# Patient Record
Sex: Male | Born: 1980 | Race: White | Hispanic: No | Marital: Married | Smoking: Never smoker
Health system: Southern US, Community
[De-identification: ages and names within clinical notes are randomized; demographics above are authoritative.]

## PROBLEM LIST (undated history)

## (undated) DIAGNOSIS — C801 Malignant (primary) neoplasm, unspecified: Secondary | ICD-10-CM

## (undated) HISTORY — PX: OTHER SURGICAL HISTORY: SHX169

## (undated) HISTORY — PX: SKIN CANCER EXCISION: SHX779

## (undated) HISTORY — DX: Malignant (primary) neoplasm, unspecified: C80.1

---

## 2014-08-17 ENCOUNTER — Ambulatory Visit (INDEPENDENT_AMBULATORY_CARE_PROVIDER_SITE_OTHER): Payer: BLUE CROSS/BLUE SHIELD | Admitting: Physician Assistant

## 2014-08-17 VITALS — BP 108/64 | HR 53 | Temp 98.3°F | Resp 14 | Ht 73.0 in | Wt 222.0 lb

## 2014-08-17 DIAGNOSIS — R51 Headache: Secondary | ICD-10-CM | POA: Diagnosis not present

## 2014-08-17 DIAGNOSIS — R519 Headache, unspecified: Secondary | ICD-10-CM

## 2014-08-17 LAB — POCT CBC
Granulocyte percent: 61.9 %G (ref 37–80)
HCT, POC: 47.9 % (ref 43.5–53.7)
HEMOGLOBIN: 15.2 g/dL (ref 14.1–18.1)
Lymph, poc: 1.4 (ref 0.6–3.4)
MCH: 30.1 pg (ref 27–31.2)
MCHC: 31.8 g/dL (ref 31.8–35.4)
MCV: 94.8 fL (ref 80–97)
MID (CBC): 0.4 (ref 0–0.9)
MPV: 8.1 fL (ref 0–99.8)
PLATELET COUNT, POC: 186 10*3/uL (ref 142–424)
POC Granulocyte: 2.9 (ref 2–6.9)
POC LYMPH PERCENT: 28.9 %L (ref 10–50)
POC MID %: 9.2 %M (ref 0–12)
RBC: 5.05 M/uL (ref 4.69–6.13)
RDW, POC: 13.4 %
WBC: 4.7 10*3/uL (ref 4.6–10.2)

## 2014-08-17 LAB — COMPLETE METABOLIC PANEL WITH GFR
ALBUMIN: 4.6 g/dL (ref 3.6–5.1)
ALT: 17 U/L (ref 9–46)
AST: 19 U/L (ref 10–40)
Alkaline Phosphatase: 54 U/L (ref 40–115)
BUN: 12 mg/dL (ref 7–25)
CALCIUM: 9.7 mg/dL (ref 8.6–10.3)
CHLORIDE: 102 mmol/L (ref 98–110)
CO2: 30 mmol/L (ref 20–31)
Creat: 1.01 mg/dL (ref 0.60–1.35)
GFR, Est African American: >89 mL/min (ref >=60)
Glucose, Bld: 82 mg/dL (ref 65–99)
Potassium: 4.5 mmol/L (ref 3.5–5.3)
Sodium: 141 mmol/L (ref 135–146)
TOTAL PROTEIN: 6.3 g/dL (ref 6.1–8.1)
Total Bilirubin: 0.7 mg/dL (ref 0.2–1.2)

## 2014-08-17 LAB — POCT SEDIMENTATION RATE: POCT SED RATE: 2 mm/hr (ref 0–22)

## 2014-08-17 MED ORDER — CYCLOBENZAPRINE HCL 10 MG PO TABS: 10.0000 mg | ORAL_TABLET | Freq: Three times a day (TID) | ORAL | Status: DC | PRN

## 2014-08-17 MED ORDER — MELOXICAM 7.5 MG PO TABS: 7.5000 mg | ORAL_TABLET | Freq: Every day | ORAL | Status: DC

## 2014-08-17 NOTE — Progress Notes (Signed)
[facility] [address] [place] [address] [phone]  Date:  08/17/2014   Name:  Roy Williams   DOB:  April 23, 1980   MRN:  [id number]  PCP:  No primary care provider on file.    History of Present Illness:  Roy Williams is a 34 y.o. male patient who presents to [facility] for headache that has occurred intermittently for the last 2-3 weeks. Patient notes that he first noticed the head pain when he was showering and he rubbed a part of his neck that radiated pain towards his right temporal lobe. They last only for seconds.  This pain can also be incited by slight impact such as stepping off of a stoop or jumping, though he has no pain when he is running on the treadmill. He became more concerned when over the last week he developed some fatigue and weakness. He has no fever, no nausea.  He has no auras photophobia or phonophobia.  He has pain into the shoulders that can radiate to the head.  This has been associated with work stressors.  He denies numbness tingling or rash.  He has been rubbing the He says that he feels lightheaded which can occur randomly even at rest. He has a history of frontal headaches.   There are no active problems to display for this patient.   Past Medical History  Diagnosis Date  . Cancer     skin    Past Surgical History  Procedure Laterality Date  . Sinus polyp removal    . Skin cancer excision      Social History  Substance Use Topics  . Smoking status: Never Smoker   . Smokeless tobacco: None  . Alcohol Use: No    Family History  Problem Relation Age of Onset  . Cancer Mother   . Cancer Maternal Grandmother     No Known Allergies  Medication list has been reviewed and updated.  No current outpatient prescriptions on file prior to visit.   No current facility-administered medications on file prior to visit.    ROS ROS otherwise unremarkable unless listed above.   Physical Examination: BP 108/64 mmHg  Pulse  53  Temp(Src) 98.3 F (36.8 C) (Oral)  Resp 14  Ht 6\' 1"  (1.854 m)  Wt 222 lb (100.699 kg)  BMI 29.30 kg/m2  SpO2 99% Ideal Body Weight: Weight in (lb) to have BMI = 25: 189.1  Physical Exam  Constitutional: He is oriented to person, place, and time. He appears well-developed and well-nourished.  HENT:  Head: Normocephalic and atraumatic.  Eyes: EOM are normal. Pupils are equal, round, and reactive to light.  Cardiovascular: Normal rate, regular rhythm, normal heart sounds and intact distal pulses.  Exam reveals no gallop and no friction rub.   No murmur heard. Pulmonary/Chest: Effort normal and breath sounds normal. No apnea. No respiratory distress. He has no decreased breath sounds. He has no wheezes.  Musculoskeletal:       Right shoulder: He exhibits tenderness (along trapezius).  Neurological: He is alert and oriented to person, place, and time. He has normal reflexes. No cranial nerve deficit. He displays a negative Romberg sign. Coordination normal.  F to N, H to S, tandem gait  Skin: Skin is warm and dry.  Erythematous non-raised marking just superior-anterior to right ear.  Non-tender, and no hair breakage.     Psychiatric: He has a normal mood and affect. His behavior is normal.  Results for orders placed or performed in visit on 08/17/14  POCT CBC  Result Value Ref Range   WBC 4.7 4.6 - 10.2 K/uL   Lymph, poc 1.4 0.6 - 3.4   POC LYMPH PERCENT 28.9 10 - 50 %L   MID (cbc) 0.4 0 - 0.9   POC MID % 9.2 0 - 12 %M   POC Granulocyte 2.9 2 - 6.9   Granulocyte percent 61.9 37 - 80 %G   RBC 5.05 4.69 - 6.13 M/uL   Hemoglobin 15.2 14.1 - 18.1 g/dL   HCT, POC 47.9 43.5 - 53.7 %   MCV 94.8 80 - 97 fL   MCH, POC 30.1 27 - 31.2 pg   MCHC 31.8 31.8 - 35.4 g/dL   RDW, POC 13.4 %   Platelet Count, POC 186 142 - 424 K/uL   MPV 8.1 0 - 99.8 fL   Orthostatic VS for the past 24 hrs:  BP- Lying Pulse- Lying BP- Sitting Pulse- Sitting BP- Standing at 0 minutes Pulse- Standing at  0 minutes  08/17/14 1304 114/74 mmHg 51 130/79 mmHg 58 123/85 mmHg 56    Assessment and Plan: 34 year old male is here today for chief complaint of head pain.  Appears to be tension with musculature based.  I am starting flexeril at night.  Advised increase in caloric intake given workout regimen.  Vitals, physical, orthostats, and cbc unremarkable at this time.  Advised to return if sxs continue, and monitor heart rate.  He is new to this practice and with his workout activities, he may have slower bpm.    1. Headache, unspecified headache type - POCT CBC - COMPLETE METABOLIC PANEL WITH GFR - POCT SEDIMENTATION RATE   Ivar Drape, PA-C [facility] 08/17/2014 1:07 PM

## 2014-08-17 NOTE — Patient Instructions (Signed)
Please increase your calorie intake to at least 3000 calories per day.     Take the flexeril at night.  Do ROM exercises of the neck, slowly flexing, extending, turning, and deviating the head. Also take ibuprofen 600mg  every 4 hours as needed for head pain.

## 2014-08-21 ENCOUNTER — Encounter: Payer: Self-pay | Admitting: Physician Assistant

## 2014-08-21 ENCOUNTER — Ambulatory Visit (INDEPENDENT_AMBULATORY_CARE_PROVIDER_SITE_OTHER): Payer: BLUE CROSS/BLUE SHIELD | Admitting: Physician Assistant

## 2014-08-21 VITALS — BP 118/76 | HR 56 | Temp 98.3°F | Resp 16 | Ht 73.0 in | Wt 218.8 lb

## 2014-08-21 DIAGNOSIS — R51 Headache: Secondary | ICD-10-CM

## 2014-08-21 DIAGNOSIS — R519 Headache, unspecified: Secondary | ICD-10-CM

## 2014-08-21 DIAGNOSIS — Z09 Encounter for follow-up examination after completed treatment for conditions other than malignant neoplasm: Secondary | ICD-10-CM

## 2014-08-21 DIAGNOSIS — R42 Dizziness and giddiness: Secondary | ICD-10-CM

## 2014-08-23 ENCOUNTER — Encounter: Payer: Self-pay | Admitting: Physician Assistant

## 2014-08-26 ENCOUNTER — Encounter: Payer: Self-pay | Admitting: Physician Assistant

## 2014-08-26 NOTE — Progress Notes (Signed)
[facility] [address] [place] [address] [phone]  Date:  08/21/2014   Name:  Roy Williams   DOB:  02-02-1980   MRN:  [id number]  PCP:  No primary care provider on file.    History of Present Illness:  Roy Williams is a 34 y.o. male patient who presents to [facility] for follow up of head pain.  He was seen here 4 days ago, for head pain that radiated from his right shoulder/neck toward his right temporal area.  He also had dizziness at this time.  He states that his sxs have resolved.  He no longer has head pain at his right side or dizziness.  He has used the flexeril as prescribed which helped his muscle tension.  He also attempted to increase his caloric intake, as instructed at his visit.  He is not sure which is improving his sxs.  His heart rate has stayed within (50-60) at home.      There are no active problems to display for this patient.   Past Medical History  Diagnosis Date  . Cancer     skin    Past Surgical History  Procedure Laterality Date  . Sinus polyp removal    . Skin cancer excision      Social History  Substance Use Topics  . Smoking status: Never Smoker   . Smokeless tobacco: None  . Alcohol Use: No    Family History  Problem Relation Age of Onset  . Cancer Mother   . Cancer Maternal Grandmother     No Known Allergies  Medication list has been reviewed and updated.  Current Outpatient Prescriptions on File Prior to Visit  Medication Sig Dispense Refill  . cyclobenzaprine (FLEXERIL) 10 MG tablet Take 1 tablet (10 mg total) by mouth 3 (three) times daily as needed for muscle spasms. 30 tablet 0   No current facility-administered medications on file prior to visit.    ROS ROS otherwsie unremarkable unless listed above.    Physical Examination: BP 118/76 mmHg  Pulse 56  Temp(Src) 98.3 F (36.8 C) (Oral)  Resp 16  Ht 6\' 1"  (1.854 m)  Wt 218 lb 12.8 oz (99.247 kg)  BMI 28.87 kg/m2  SpO2 96% Ideal Body  Weight: Weight in (lb) to have BMI = 25: 189.1  Physical Exam  Constitutional: He is oriented to person, place, and time.  Cardiovascular: Regular rhythm, normal heart sounds and intact distal pulses.  Bradycardia present.  Exam reveals no friction rub.   No murmur heard. Pulses:      Radial pulses are 2+ on the right side, and 2+ on the left side.       Dorsalis pedis pulses are 2+ on the right side, and 2+ on the left side.       Posterior tibial pulses are 2+ on the right side, and 2+ on the left side.  Mildly bradycardic, but otherwise normal.  Pulmonary/Chest: Effort normal and breath sounds normal. No respiratory distress. He has no wheezes.  Musculoskeletal:       Right shoulder: Normal.  Neurological: He is alert and oriented to person, place, and time. He displays no atrophy. No cranial nerve deficit. He exhibits normal muscle tone. Coordination normal.  Normal F to N, H to S.  Normal tandem gait.    Skin: Skin is warm and intact.  Erythematous annular marking has resolved at the right side of his head.  Assessment and Plan: 34 year old male with PMH listed above is here today for follow up of head pain and dizziness.  This has resolved.  Resting HR within same range with full resolve of his sxs, so this appears to be his baseline bpm (51-59).  I have alerted him to return if his sxs re-emerge, and advised to continue caloric intake.  Previous labs showed wnl (SR, CMET, cbc).      Follow up  Headache, unspecified headache type  Ivar Drape, PA-C [facility] 08/26/2014 10:10 AM

## 2014-08-28 ENCOUNTER — Ambulatory Visit: Payer: Self-pay | Admitting: Family

## 2014-09-18 ENCOUNTER — Ambulatory Visit (INDEPENDENT_AMBULATORY_CARE_PROVIDER_SITE_OTHER): Payer: BLUE CROSS/BLUE SHIELD | Admitting: Physician Assistant

## 2014-09-18 ENCOUNTER — Encounter: Payer: Self-pay | Admitting: Physician Assistant

## 2014-09-18 VITALS — BP 110/70 | HR 55 | Temp 98.8°F | Resp 16 | Ht 73.0 in | Wt 213.0 lb

## 2014-09-18 DIAGNOSIS — Z13220 Encounter for screening for lipoid disorders: Secondary | ICD-10-CM

## 2014-09-18 DIAGNOSIS — Z0000 Encounter for general adult medical examination without abnormal findings: Secondary | ICD-10-CM | POA: Diagnosis not present

## 2014-09-18 DIAGNOSIS — Z23 Encounter for immunization: Secondary | ICD-10-CM

## 2014-09-18 LAB — LIPID PANEL
Cholesterol: 132 mg/dL (ref 125–200)
HDL: 54 mg/dL (ref >=40)
LDL Cholesterol: 67 mg/dL (ref <130)
Total CHOL/HDL Ratio: 2.4 Ratio (ref <=5.0)
Triglycerides: 53 mg/dL (ref <150)
VLDL: 11 mg/dL (ref <30)

## 2014-09-18 NOTE — Patient Instructions (Signed)
I will have your test results in 7 days. Listed below are general physical health tips.  Keeping you healthy  Get these tests  Blood pressure- Have your blood pressure checked once a year by your healthcare provider.  Normal blood pressure is 120/80.  Weight- Have your body mass index (BMI) calculated to screen for obesity.  BMI is a measure of body fat based on height and weight. You can also calculate your own BMI at GravelBags.it.  Cholesterol- Have your cholesterol checked regularly starting at age 39, sooner may be necessary if you have diabetes, high blood pressure, if a family member developed heart diseases at an early age or if you smoke.   Chlamydia, HIV, and other sexual transmitted disease- Get screened each year until the age of 83 then within three months of each new sexual partner.  Diabetes- Have your blood sugar checked regularly if you have high blood pressure, high cholesterol, a family history of diabetes or if you are overweight.  Get these vaccines  Flu shot- Every fall.  Tetanus shot- Every 10 years.  Menactra- Single dose; prevents meningitis.  Take these steps  Don't smoke- If you do smoke, ask your healthcare provider about quitting. For tips on how to quit, go to www.smokefree.gov or call 1-800-QUIT-NOW.  Be physically active- Exercise 5 days a week for at least 30 minutes.  If you are not already physically active start slow and gradually work up to 30 minutes of moderate physical activity.  Examples of moderate activity include walking briskly, mowing the yard, dancing, swimming bicycling, etc.  Eat a healthy diet- Eat a variety of healthy foods such as fruits, vegetables, low fat milk, low fat cheese, yogurt, lean meats, poultry, fish, beans, tofu, etc.  For more information on healthy eating, go to www.thenutritionsource.org  Drink alcohol in moderation- Limit alcohol intake two drinks or less a day.  Never drink and drive.  Dentist- Brush  and floss teeth twice daily; visit your dentis twice a year.  Depression-Your emotional health is as important as your physical health.  If you're feeling down, losing interest in things you normally enjoy please talk with your healthcare provider.  Gun Safety- If you keep a gun in your home, keep it unloaded and with the safety lock on.  Bullets should be stored separately.  Helmet use- Always wear a helmet when riding a motorcycle, bicycle, rollerblading or skateboarding.  Safe sex- If you may be exposed to a sexually transmitted infection, use a condom  Seat belts- Seat bels can save your life; always wear one.  Smoke/Carbon Monoxide detectors- These detectors need to be installed on the appropriate level of your home.  Replace batteries at least once a year.  Skin Cancer- When out in the sun, cover up and use sunscreen SPF 15 or higher. Violence- If anyone is threatening or hurting you, please tell your healthcare provider.

## 2014-09-18 NOTE — Progress Notes (Signed)
[facility] [address] [place] [address] [phone]  Date:  09/18/2014   Name:  KERMIT ARNETTE   DOB:  [birth year]   MRN:  [id number]  PCP:  No primary care provider on file.    History of Present Illness:  RILAN EILAND is a 34 y.o. male patient who presents to [facility] for annual physical exam.  Diet: Eating more protein, eating eggs and toast.  Smoothies.  Nuts.  Salads with chicken or tuna, or rice for lunch.  Increasing your protein.     BM: Normal.  Qd.  No constipation, diarrhea, no blood in the stool  Urination: No dysuria, hematuria, frequency.  Sleep: Good.  10pm-6am.  Sleeping through the night.    Social: Hangs with wife, and with work friends, festivals.  Wife is in school.  Gym.  Normal sexual functioning.     Exercise: 5x per week.  Intentional weight loss.   EtOH: On occasion <1 week. Tobacco: None.  Former smoker 9 years, 1/2 pack 7 years.   Illicit drug use: No  He has been checking his heart rate which has been around 60.  He is not complaining of dizziness, fatigue, or dyspnea.    There are no active problems to display for this patient.   Past Medical History  Diagnosis Date  . Cancer     skin    Past Surgical History  Procedure Laterality Date  . Sinus polyp removal    . Skin cancer excision      Social History  Substance Use Topics  . Smoking status: Never Smoker   . Smokeless tobacco: None  . Alcohol Use: No    Family History  Problem Relation Age of Onset  . Cancer Mother     skin cancer  . Cancer Maternal Grandmother   . COPD Father     No Known Allergies  Medication list has been reviewed and updated.  No current outpatient prescriptions on file prior to visit.   No current facility-administered medications on file prior to visit.    Review of Systems  Constitutional: Negative for fever and chills.  HENT: Negative for ear discharge, ear pain and sore throat.   Eyes: Negative for blurred vision  and double vision.  Respiratory: Negative for cough, shortness of breath and wheezing.   Cardiovascular: Negative for chest pain, palpitations and leg swelling.  Gastrointestinal: Negative for nausea, vomiting and diarrhea.  Genitourinary: Negative for dysuria, frequency and hematuria.  Skin: Negative for itching and rash.  Neurological: Negative for dizziness and headaches.     Physical Examination: BP 130/73 mmHg  Pulse 55  Temp(Src) 98.8 F (37.1 C)  Resp 16  Ht 6\' 1"  (1.854 m)  Wt 213 lb (96.616 kg)  BMI 28.11 kg/m2 Ideal Body Weight: Weight in (lb) to have BMI = 25: 189.1 Wt Readings from Last 3 Encounters:  09/18/14 213 lb (96.616 kg)  08/21/14 218 lb 12.8 oz (99.247 kg)  08/17/14 222 lb (100.699 kg)    Physical Exam  Constitutional: He is oriented to person, place, and time. He appears well-developed and well-nourished. No distress.  HENT:  Head: Normocephalic and atraumatic.  Right Ear: Tympanic membrane, external ear and ear canal normal.  Left Ear: Tympanic membrane, external ear and ear canal normal.  Eyes: Conjunctivae and EOM are normal. Pupils are equal, round, and reactive to light.  Cardiovascular: Normal rate and regular rhythm.  Exam reveals no gallop, no distant heart sounds  and no friction rub.   No murmur heard. Pulmonary/Chest: Effort normal. No respiratory distress. He has no decreased breath sounds. He has no wheezes. He has no rhonchi.  Abdominal: Soft. Normal appearance and bowel sounds are normal. There is no hepatosplenomegaly. There is no tenderness. There is no CVA tenderness, no tenderness at McBurney's point and negative Murphy's sign.  Genitourinary:  Declines at this time.   Neurological: He is alert and oriented to person, place, and time.  Skin: Skin is warm and dry. He is not diaphoretic.  Psychiatric: He has a normal mood and affect. His behavior is normal.   Assessment and Plan: 34 year old male is here today for annual physical exam.   Recent liver and kidney function are normal.  BP rechecked to be normal (see note).  Resting heart rate normal.  Annual physical exam - Plan: Lipid panel  Need for prophylactic vaccination and inoculation against influenza - Plan: Flu Vaccine QUAD 36+ mos IM  Screening for lipid disorders - Plan: Lipid panel  Ivar Drape, PA-C [facility] 09/18/2014 3:04 PM

## 2014-09-20 ENCOUNTER — Encounter: Payer: Self-pay | Admitting: Physician Assistant

## 2014-09-20 ENCOUNTER — Encounter: Payer: Self-pay | Admitting: Family Medicine

## 2015-04-01 ENCOUNTER — Ambulatory Visit (HOSPITAL_COMMUNITY)
Admission: RE | Admit: 2015-04-01 | Discharge: 2015-04-01 | Disposition: A | Discharge status: Home / Self Care | Payer: BLUE CROSS/BLUE SHIELD | Source: Ambulatory Visit | Attending: Emergency Medicine | Admitting: Emergency Medicine

## 2015-04-01 ENCOUNTER — Ambulatory Visit (INDEPENDENT_AMBULATORY_CARE_PROVIDER_SITE_OTHER): Payer: BLUE CROSS/BLUE SHIELD | Admitting: Emergency Medicine

## 2015-04-01 VITALS — BP 132/70 | HR 54 | Temp 98.5°F | Resp 20 | Ht 73.0 in | Wt 206.4 lb

## 2015-04-01 DIAGNOSIS — N50811 Right testicular pain: Secondary | ICD-10-CM

## 2015-04-01 LAB — POCT CBC
Granulocyte percent: 72.8 %G (ref 37–80)
HEMATOCRIT: 47.6 % (ref 43.5–53.7)
Hemoglobin: 16.8 g/dL (ref 14.1–18.1)
LYMPH, POC: 1 (ref 0.6–3.4)
MCH, POC: 33.5 pg — AB (ref 27–31.2)
MCHC: 35.2 g/dL (ref 31.8–35.4)
MCV: 95.2 fL (ref 80–97)
MID (cbc): 0.5 (ref 0–0.9)
MPV: 7.8 fL (ref 0–99.8)
POC GRANULOCYTE: 3.9 (ref 2–6.9)
POC LYMPH %: 18.2 % (ref 10–50)
POC MID %: 9 % (ref 0–12)
Platelet Count, POC: 161 10*3/uL (ref 142–424)
RBC: 5 M/uL (ref 4.69–6.13)
RDW, POC: 11.8 %
WBC: 5.4 10*3/uL (ref 4.6–10.2)

## 2015-04-01 LAB — COMPLETE METABOLIC PANEL WITH GFR
ALBUMIN: 4.6 g/dL (ref 3.6–5.1)
ALT: 27 U/L (ref 9–46)
AST: 32 U/L (ref 10–40)
Alkaline Phosphatase: 57 U/L (ref 40–115)
BILIRUBIN TOTAL: 0.7 mg/dL (ref 0.2–1.2)
BUN: 18 mg/dL (ref 7–25)
CO2: 29 mmol/L (ref 20–31)
CREATININE: 1.02 mg/dL (ref 0.60–1.35)
Calcium: 9.4 mg/dL (ref 8.6–10.3)
Chloride: 103 mmol/L (ref 98–110)
GFR, Est African American: >89 mL/min (ref >=60)
GFR, Est Non African American: >89 mL/min (ref >=60)
GLUCOSE: 83 mg/dL (ref 65–99)
Potassium: 4.3 mmol/L (ref 3.5–5.3)
Sodium: 143 mmol/L (ref 135–146)
Total Protein: 6.5 g/dL (ref 6.1–8.1)

## 2015-04-01 LAB — POC MICROSCOPIC URINALYSIS (UMFC): MUCUS RE: ABSENT

## 2015-04-01 LAB — POCT URINALYSIS DIP (MANUAL ENTRY)
BILIRUBIN UA: NEGATIVE
BILIRUBIN UA: NEGATIVE
Blood, UA: NEGATIVE
GLUCOSE UA: NEGATIVE
LEUKOCYTES UA: NEGATIVE
NITRITE UA: NEGATIVE
PH UA: 7
Protein Ur, POC: NEGATIVE
Spec Grav, UA: 1.01
Urobilinogen, UA: 0.2

## 2015-04-01 NOTE — Patient Instructions (Addendum)
Arrive at [facility] at 12:30 pm for your outpatient U/S. 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 Please contact [facility] at [phone] with questions or concerns regarding your invoice.   IF you received labwork today, you will receive an invoice from [employer]. Please contact [facility] at [phone] with questions or concerns regarding your invoice.   Our billing staff will not be able to assist you with questions regarding bills from these companies.  You will be contacted with the lab results as soon as they are available. The fastest way to get your results is to activate your My Chart account. Instructions are located on the last page of this paperwork. If you have not heard from [place] regarding the results in 2 weeks, please contact this office.

## 2015-04-01 NOTE — Progress Notes (Signed)
Patient ID: Roy Williams, male   DOB: [birth year], 35 y.o.   MRN: [id number]     By signing my name below, I, Roy Williams, attest that this documentation has been prepared under the direction and in the presence of Arlyss Queen, MD.  Electronically Signed: Zola Williams, Medical Scribe. 04/01/2015. 11:07 AM.   Chief Complaint:  Chief Complaint  Patient presents with  . Testicle Pain    swelling x 3 days    HPI: Roy ZALOUDEK is a 35 y.o. male who reports to [facility] today complaining of intermittent right testicular pain that started 2 days ago. He woke up 2 days ago with discomfort in the testicle. Patient notes he only has pain with movement of his legs, when he moves his legs inwards. He does not have any pain with palpation of the testicle. He thinks there may be some swelling in the testicle. He works out 5 days a week and takes protein and amino acid supplements as well an herbal supplement. Patient denies back pain, leg pain, and abdominal pain. He also denies known injury. No FMHx of prostate cancer.  Past Medical History  Diagnosis Date  . Cancer [facility])     skin   Past Surgical History  Procedure Laterality Date  . Sinus polyp removal    . Skin cancer excision     Social History   Social History  . Marital Status: Married    Spouse Name: N/A  . Number of Children: N/A  . Years of Education: N/A   Social History Main Topics  . Smoking status: Never Smoker   . Smokeless tobacco: None  . Alcohol Use: No  . Drug Use: No  . Sexual Activity: Yes   Other Topics Concern  . None   Social History Narrative   Family History  Problem Relation Age of Onset  . Cancer Mother     skin cancer  . Cancer Maternal Grandmother   . COPD Father    No Known Allergies Prior to Admission medications   Not on File     ROS: The patient denies fevers, chills, night sweats, unintentional weight loss, chest pain, palpitations, wheezing, dyspnea on exertion, nausea, vomiting, abdominal  pain, dysuria, hematuria, melena, numbness, weakness, or tingling.  All other systems have been reviewed and were otherwise negative with the exception of those mentioned in the HPI and as above.    PHYSICAL EXAM: Filed Vitals:   04/01/15 1019  BP: 132/70  Pulse: 54  Temp: 98.5 F (36.9 C)  Resp: 20   Body mass index is 27.24 kg/(m^2).   General: Alert, no acute distress HEENT:  Normocephalic, atraumatic, oropharynx patent. Eye: Juliette Mangle [facility] Cardiovascular:  Regular rate and rhythm, no rubs murmurs or gallops.  No Carotid bruits, radial pulse intact. No pedal edema.  Respiratory: Clear to auscultation bilaterally.  No wheezes, rales, or rhonchi.  No cyanosis, no use of accessory musculature Abdominal: No organomegaly, abdomen is soft and non-tender, positive bowel sounds.  No masses. Musculoskeletal: Gait intact. No edema, tenderness Skin: No rashes. Neurologic: Facial musculature symmetric. Psychiatric: Patient acts appropriately throughout our interaction. Lymphatic: No cervical or submandibular lymphadenopathy Genitourinary: Mild tenderness at the right external inguinal ring. Testicles normal without masses.  LABS:    EKG/XRAY:   Primary read interpreted by Dr. Everlene Farrier at [facility].   ASSESSMENT/PLAN: Ultrasound of the testicle ordered. I suspect this is a groin strain. Patient works out regularly at [employer].I personally performed the services described in this  documentation, which was scribed in my presence. The recorded information has been reviewed and is accurate.    Gross sideeffects, risk and benefits, and alternatives of medications d/w patient. Patient is aware that all medications have potential sideeffects and we are unable to predict every sideeffect or drug-drug interaction that may occur.  Arlyss Queen MD 04/01/2015 11:07 AM

## 2015-04-02 LAB — PSA: PSA: 0.63 ng/mL (ref <=4.00)

## 2015-04-02 LAB — GC/CHLAMYDIA PROBE AMP
CT PROBE, AMP APTIMA: NOT DETECTED
GC Probe RNA: NOT DETECTED

## 2015-10-19 ENCOUNTER — Ambulatory Visit (INDEPENDENT_AMBULATORY_CARE_PROVIDER_SITE_OTHER): Payer: BLUE CROSS/BLUE SHIELD | Admitting: Physician Assistant

## 2015-10-19 VITALS — BP 116/78 | HR 62 | Temp 98.1°F | Resp 17 | Ht 73.0 in | Wt 213.0 lb

## 2015-10-19 DIAGNOSIS — Z008 Encounter for other general examination: Secondary | ICD-10-CM | POA: Diagnosis not present

## 2015-10-19 DIAGNOSIS — Z1329 Encounter for screening for other suspected endocrine disorder: Secondary | ICD-10-CM

## 2015-10-19 DIAGNOSIS — Z0000 Encounter for general adult medical examination without abnormal findings: Secondary | ICD-10-CM

## 2015-10-19 DIAGNOSIS — Z23 Encounter for immunization: Secondary | ICD-10-CM | POA: Diagnosis not present

## 2015-10-19 DIAGNOSIS — Z130 Encounter for screening for diseases of the blood and blood-forming organs and certain disorders involving the immune mechanism: Secondary | ICD-10-CM | POA: Diagnosis not present

## 2015-10-19 LAB — CBC
HCT: 49.3 % (ref 38.5–50.0)
HEMOGLOBIN: 16.8 g/dL (ref 13.2–17.1)
MCH: 32.4 pg (ref 27.0–33.0)
MCHC: 34.1 g/dL (ref 32.0–36.0)
MCV: 95.2 fL (ref 80.0–100.0)
MPV: 10.3 fL (ref 7.5–12.5)
PLATELETS: 179 10*3/uL (ref 140–400)
RBC: 5.18 MIL/uL (ref 4.20–5.80)
RDW: 12.4 % (ref 11.0–15.0)
WBC: 5 10*3/uL (ref 3.8–10.8)

## 2015-10-19 LAB — TSH: TSH: 1.21 m[IU]/L (ref 0.40–4.50)

## 2015-10-19 NOTE — Progress Notes (Signed)
Patient ID: Roy Williams, male   DOB: [birth year], 35 y.o.   MRN: [id number] [facility] [address], [place] [address] [phone]  By signing my name below, I, Roy Williams, attest that this documentation has been prepared under the direction and in the presence of Ivar Drape, PA-C Electronically Signed: Ladene Artist, ED Scribe 10/19/2015 at 9:21 AM.  Date:  10/19/2015   Name:  Roy Williams   DOB:  1980/09/25   MRN:  [id number]  PCP:  Ivar Drape, PA   History of Present Illness:  [name] is a 35 y.o. male patient who presents to [facility] for an annual exam.   Diet: Pt states that he eats 3 meals and 2 snacks daily without restrictions. His breakfast includes eggs, smoothies, and toast. Pt states that he avoids fried foods, sodas and sweet teas. Pt states that he eats a lot of spinach salads and fruits but wants to eat more vegetables.  Urination: Pt denies dysuria, hematuria, urinary frequency.  BM: He reports daily BMs. Pt denies constipation, diarrhea, blood in stools, melena.  Sleep: Pt denies insomnia but states that he has not been sleeping well the past 6 months due to stress from work. Pt works as an Optometrist.  Social Activity: Pt and his wife are expecting their first child (daughter) on 04/05/16. They have been happily married for almost 10 years. He reports 1-2 drinks a month. Pt is a former smoker but quit 10 years ago. He smoked 0.5 ppd for 8 years. He denies illicit drug use.  He has noticed HAs over the weekends that he attributes to stressful work weeks. He denies dizziness. Pt states that HAs resolve with Excedrin. Pt has also tried herbal supplements for stress. He is also stressed about the future of his job which has the potential to relocate and preparing for his daughter. He reports that he used to take deep breaths to "catch his breath" when he was feeling overwhelmed and noticed some anxiety but he denies chest pain and  palpitations. Pt saw a psychiatrist once but states that he did not like the conversation. He states that he was told that "he was a perfectionist" and that was his issue so he never returned.   Wt Readings from Last 3 Encounters:  10/19/15 213 lb (96.6 kg)  04/01/15 206 lb 6.4 oz (93.6 kg)  09/18/14 213 lb (96.6 kg)    There are no active problems to display for this patient.   Past Medical History:  Diagnosis Date   Cancer ([place])    skin    Past Surgical History:  Procedure Laterality Date   sinus polyp removal     SKIN CANCER EXCISION      Social History  Substance Use Topics   Smoking status: Never Smoker   Smokeless tobacco: Not on file   Alcohol use No    Family History  Problem Relation Age of Onset   Cancer Mother     skin cancer   Cancer Maternal Grandmother    COPD Father     No Known Allergies  Medication list has been reviewed and updated.  No current outpatient prescriptions on file prior to visit.   No current facility-administered medications on file prior to visit.     Review of Systems  Cardiovascular: Negative for chest pain and palpitations.  Gastrointestinal: Negative for blood in stool, constipation, diarrhea and melena.  Genitourinary: Negative for dysuria, frequency and hematuria.  Neurological: Positive for headaches. Negative for dizziness.  Psychiatric/Behavioral: The patient is nervous/anxious. The patient does not have insomnia.     Physical Examination: BP 116/78 (BP Location: Right Arm, Patient Position: Sitting, Cuff Size: Large)    Pulse 62    Temp 98.1 F (36.7 C) (Oral)    Resp 17    Ht 6\' 1"  (1.854 m)    Wt 213 lb (96.6 kg)    SpO2 97%    BMI 28.10 kg/m  Ideal Body Weight: @FLOWAMB [id number]  Physical Exam  Constitutional: He is oriented to person, place, and time. He appears well-developed and well-nourished. No distress.  HENT:  Head: Normocephalic and atraumatic.  Right Ear: Tympanic membrane, external  ear and ear canal normal.  Left Ear: Tympanic membrane, external ear and ear canal normal.  Eyes: Conjunctivae and EOM are normal. Pupils are equal, round, and reactive to light.  Cardiovascular: Normal rate and regular rhythm.  Exam reveals no friction rub.   No murmur heard. Pulmonary/Chest: Effort normal. No respiratory distress. He has no wheezes.  Abdominal: Soft. Bowel sounds are normal. He exhibits no distension and no mass. There is no tenderness.  Musculoskeletal: Normal range of motion. He exhibits no edema or tenderness.  Neurological: He is alert and oriented to person, place, and time. He displays normal reflexes.  Skin: Skin is warm and dry. He is not diaphoretic.  Psychiatric: He has a normal mood and affect. His behavior is normal.    Assessment and Plan: Roy Williams is a 35 y.o. male who is here today for cc of annual physical exam.   Normal.  Labs pending--will follow up with results.  Annual physical exam - Plan: CBC, TSH, EKG 12-Lead, Tdap vaccine greater than or equal to 7yo IM  Screening for thyroid disorder - Plan: TSH  Screening for deficiency anemia - Plan: CBC  Encounter for electrocardiogram - Plan: EKG 12-Lead  Need for Tdap vaccination - Plan: Tdap vaccine greater than or equal to 7yo IM  Ivar Drape, PA-C [facility] 10/20/201710:43 AM   I personally performed the services described in this documentation, which was scribed in my presence. The recorded information has been reviewed and is accurate.

## 2015-10-19 NOTE — Patient Instructions (Addendum)
Make sure that you are getting half plate of green leafy vegetables per day. Keeping you healthy  Get these tests  Blood pressure- Have your blood pressure checked once a year by your healthcare provider.  Normal blood pressure is 120/80.  Weight- Have your body mass index (BMI) calculated to screen for obesity.  BMI is a measure of body fat based on height and weight. You can also calculate your own BMI at [employer]velBags.it.  Cholesterol- Have your cholesterol checked regularly starting at age 36, sooner may be necessary if you have diabetes, high blood pressure, if a family member developed heart diseases at an early age or if you smoke.   Chlamydia, HIV, and other sexual transmitted disease- Get screened each year until the age of 36 then within three months of each new sexual partner.  Diabetes- Have your blood sugar checked regularly if you have high blood pressure, high cholesterol, a family history of diabetes or if you are overweight.  Get these vaccines  Flu shot- Every fall.  Tetanus shot- Every 10 years.  Menactra- Single dose; prevents meningitis.  Take these steps  Don't smoke- If you do smoke, ask your healthcare provider about quitting. For tips on how to quit, go to www.smokefree.gov or call 1-800-QUIT-NOW.  Be physically active- Exercise 5 days a week for at least 30 minutes.  If you are not already physically active start slow and gradually work up to 30 minutes of moderate physical activity.  Examples of moderate activity include walking briskly, mowing the yard, dancing, swimming bicycling, etc.  Eat a healthy diet- Eat a variety of healthy foods such as fruits, vegetables, low fat milk, low fat cheese, yogurt, lean meats, poultry, fish, beans, tofu, etc.  For more information on healthy eating, go to www.thenutritionsource.org  Drink alcohol in moderation- Limit alcohol intake two drinks or less a day.  Never drink and drive.  Dentist- Brush and floss  teeth twice daily; visit your dentis twice a year.  Depression-Your emotional health is as important as your physical health.  If you're feeling down, losing interest in things you normally enjoy please talk with your healthcare provider.  Gun Safety- If you keep a gun in your home, keep it unloaded and with the safety lock on.  Bullets should be stored separately.  Helmet use- Always wear a helmet when riding a motorcycle, bicycle, rollerblading or skateboarding.  Safe sex- If you may be exposed to a sexually transmitted infection, use a condom  Seat belts- Seat bels can save your life; always wear one.  Smoke/Carbon Monoxide detectors- These detectors need to be installed on the appropriate level of your home.  Replace batteries at least once a year.  Skin Cancer- When out in the sun, cover up and use sunscreen SPF 15 or higher.  Violence- If anyone is threatening or hurting you, please tell your healthcare provider.    IF you received an x-ray today, you will receive an invoice from [facility]. Please contact [facility] at [phone] with questions or concerns regarding your invoice.   IF you received labwork today, you will receive an invoice from [employer]. Please contact [facility] at [phone] with questions or concerns regarding your invoice.   Our billing staff will not be able to assist you with questions regarding bills from these companies.  You will be contacted with the lab results as soon as they are available. The fastest way to get your results is to activate your My Chart  account. Instructions are located on the last page of this paperwork. If you have not heard from [place] regarding the results in 2 weeks, please contact this office.

## 2015-11-11 ENCOUNTER — Encounter: Payer: Self-pay | Admitting: Physician Assistant

## 2016-10-19 ENCOUNTER — Ambulatory Visit: Payer: BLUE CROSS/BLUE SHIELD | Admitting: Family Medicine

## 2017-04-14 ENCOUNTER — Encounter: Payer: Self-pay | Admitting: Physician Assistant

## 2018-01-21 IMAGING — US US ART/VEN ABD/PELV/SCROTUM DOPPLER LTD
1 series · 14 of 25 positions shown · non-contrast
Comparison: None.

CLINICAL DATA: Right testicle pain for 3 days.

EXAM:
SCROTAL ULTRASOUND
DOPPLER ULTRASOUND OF THE TESTICLES
TECHNIQUE: Complete ultrasound examination of the testicles, epididymis, and
other scrotal structures was performed. Color and spectral Doppler
ultrasound were also utilized to evaluate blood flow to the
testicles.

[Series 1: us art/ven abd/pelv/scrotum doppler ltd · 0.07mm/px · 14 of 64 slices shown]
[im 1/64]
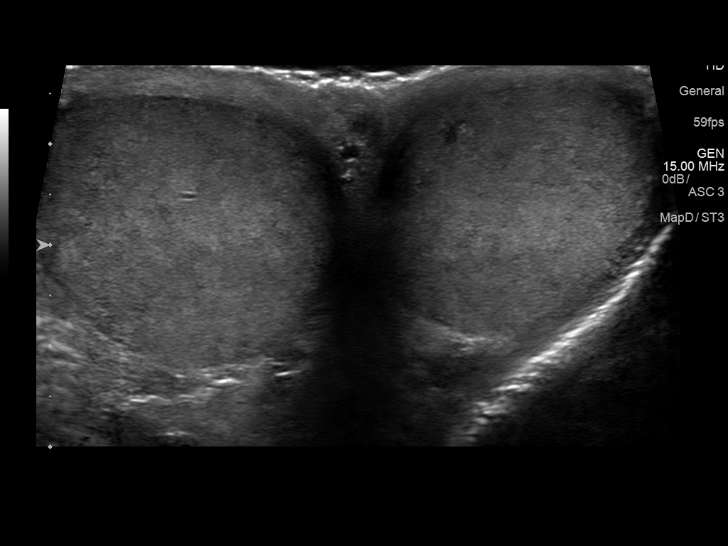
[im 6/64]
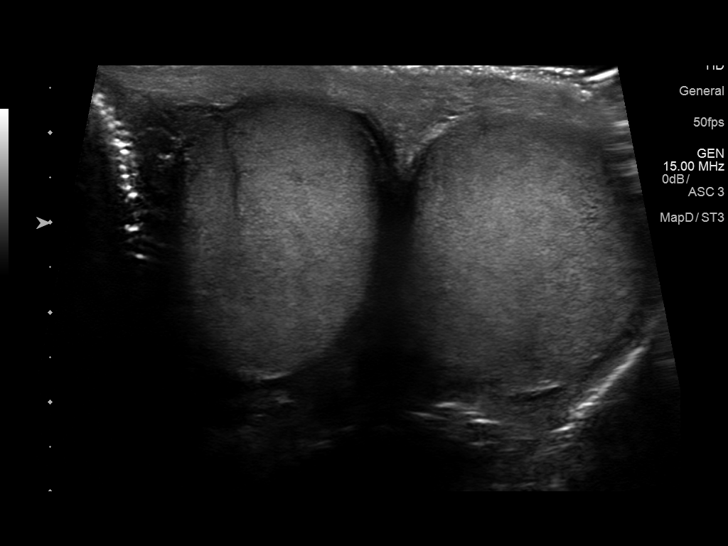
[im 11/64]
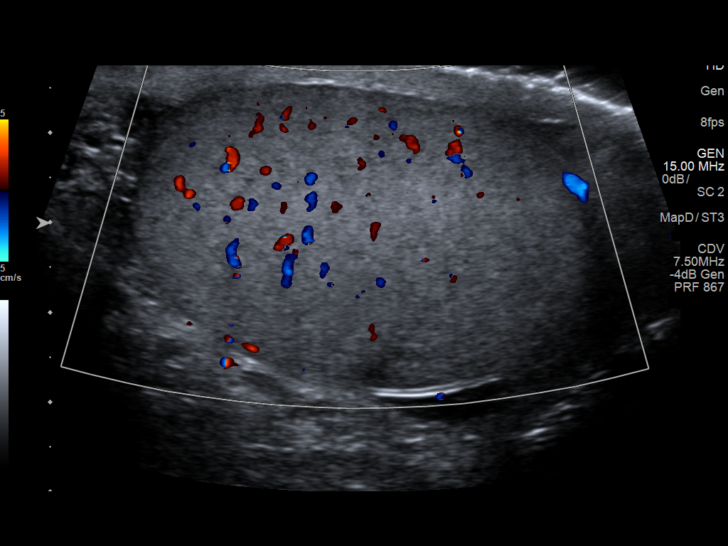
[im 16/64]
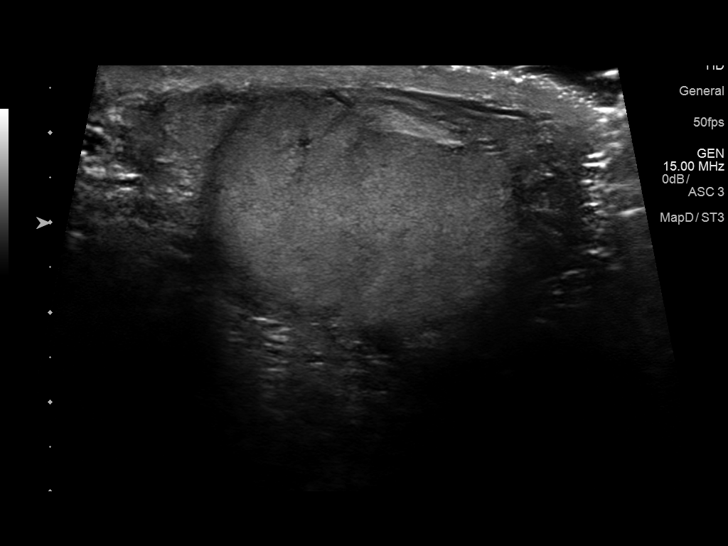
[im 22/64]
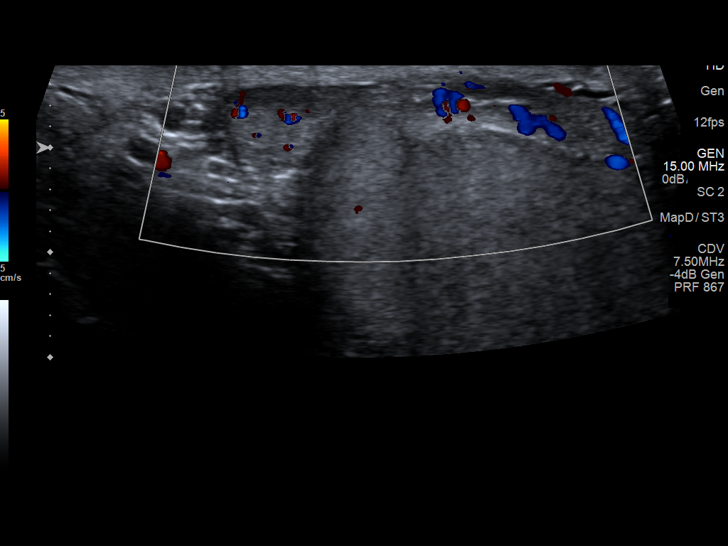
[im 24/64]
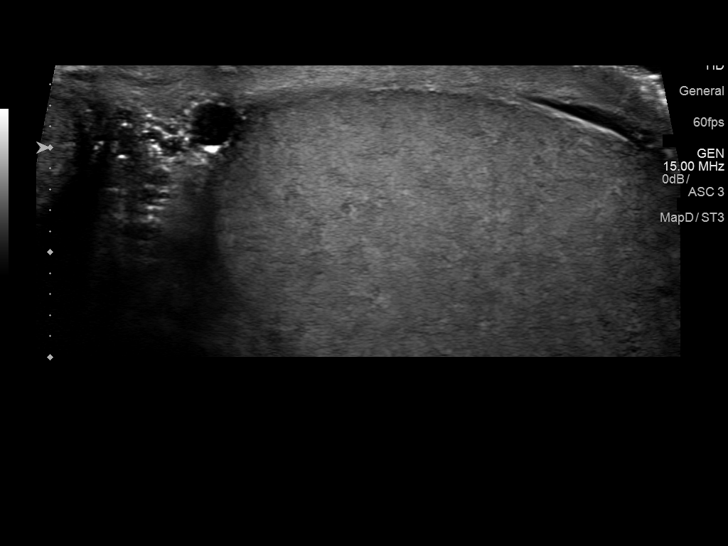
[im 29/64]
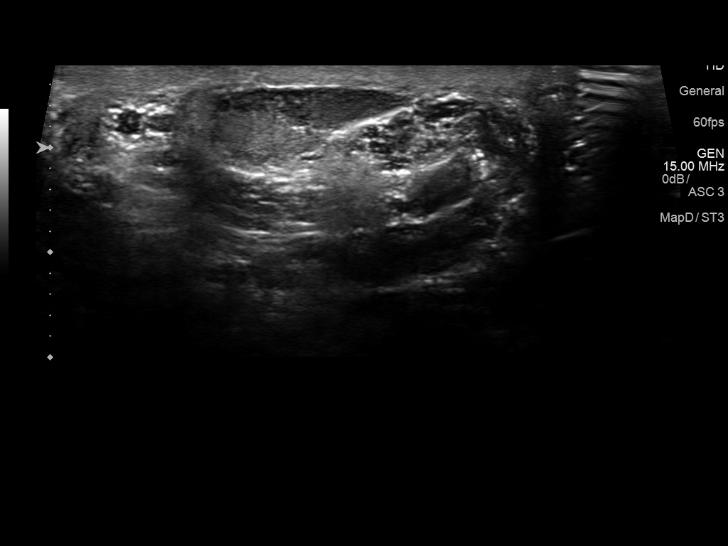
[im 35/64]
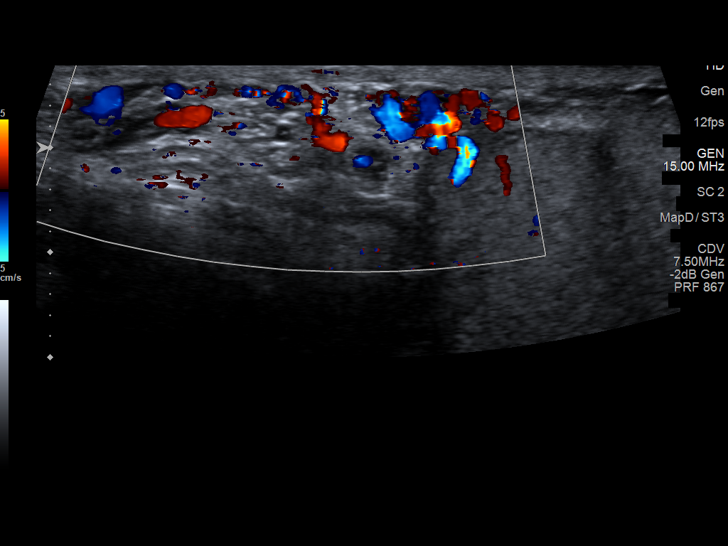
[im 40/64]
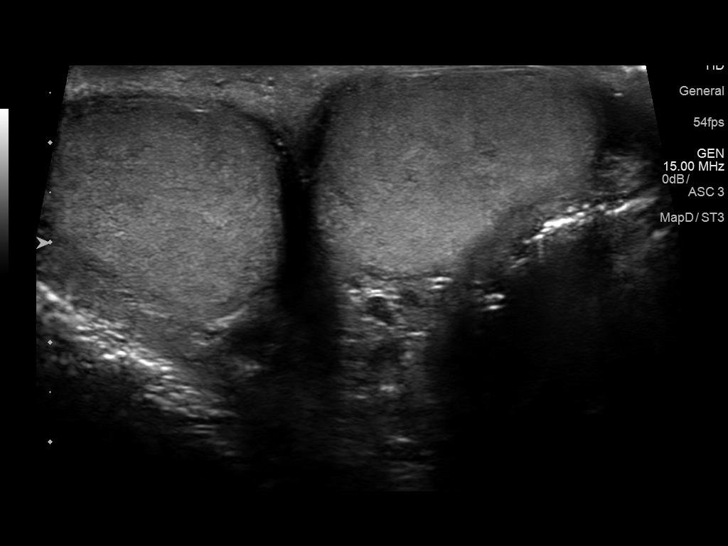
[im 43/64]
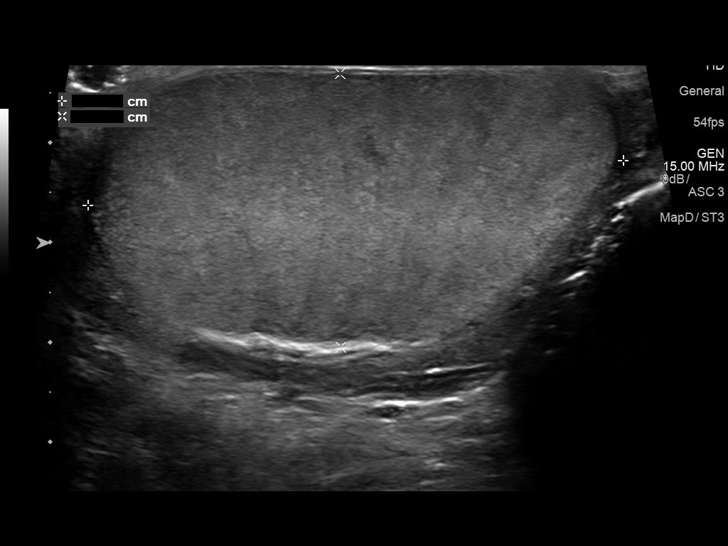
[im 48/64]
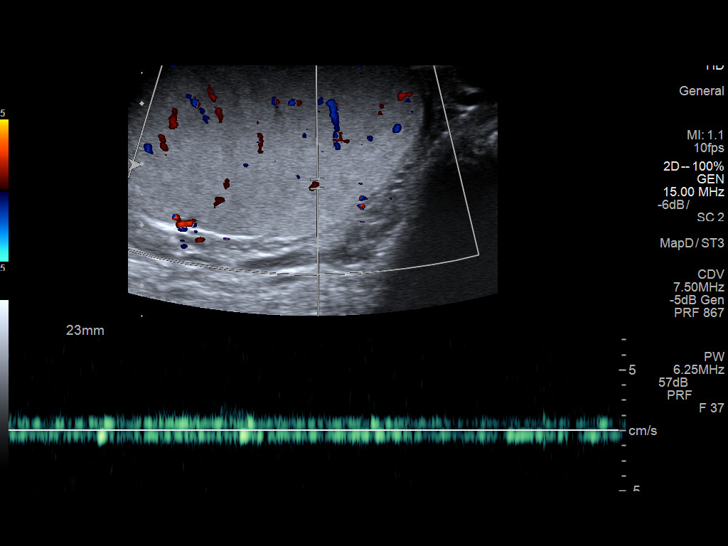
[im 53/64]
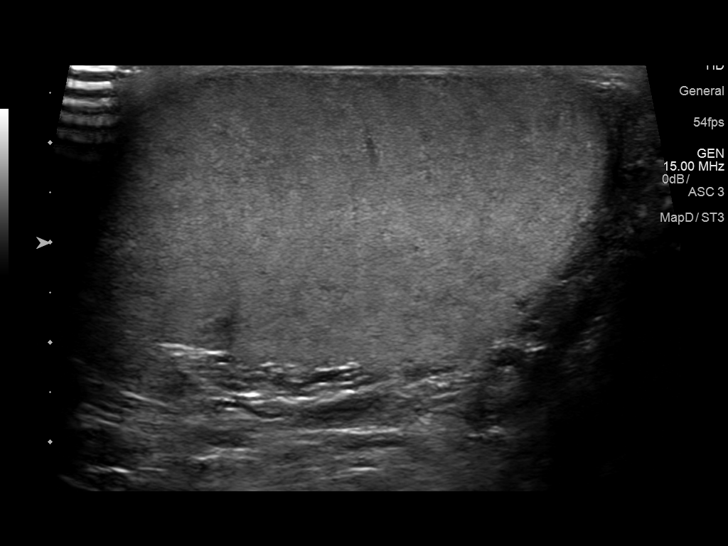
[im 58/64]
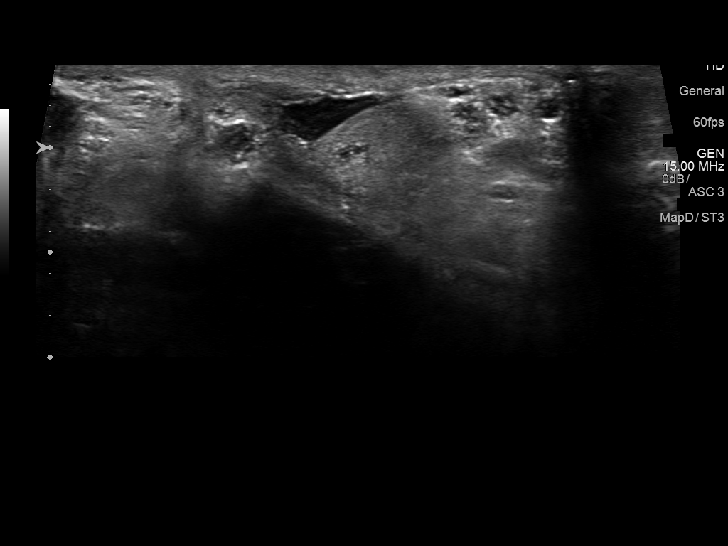
[im 64/64]
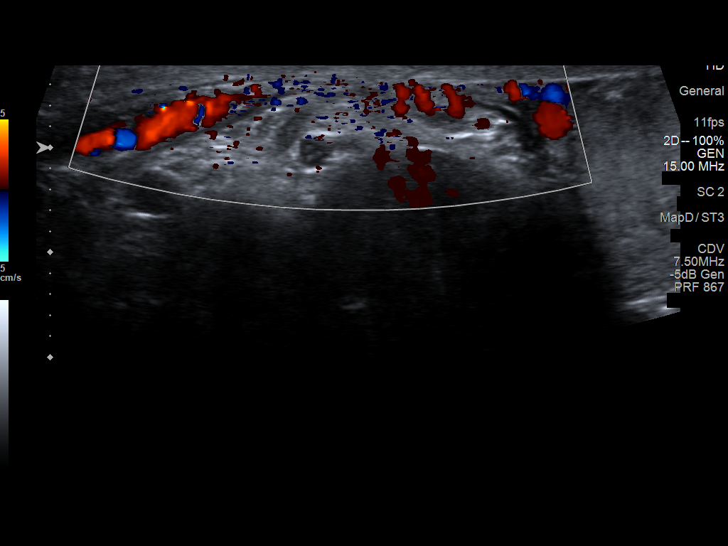

[14 of 25 positions shown; findings below may reference images not displayed]

FINDINGS: Right testicle

Measurements: 5.1 x 3.2 x 2.9 cm. Overall echotexture is within
normal limits. No mass or microlithiasis visualized.

Left testicle

Measurements: 5.4 x 2.7 x 3.1 cm. Overall echotexture is within
normal limits. No mass or microlithiasis visualized.

Right epididymis: Normal in size and appearance. Incidentally noted
is a small epididymal cyst.

Left epididymis:  Normal in size and appearance.

Hydrocele:  None visualized.

Varicocele:  None visualized.

Pulsed Doppler interrogation of both testes demonstrates normal low
resistance arterial and venous waveforms bilaterally.
IMPRESSION: Normal scrotal ultrasound. Both testicles appear normal. No evidence
of testicular torsion or orchitis. No evidence of epididymitis.
